# Patient Record
Sex: Female | Born: 1985 | Hispanic: No | Marital: Married
Health system: Southern US, Community
[De-identification: ages and names within clinical notes are randomized; demographics above are authoritative.]

---

## 2010-01-27 ENCOUNTER — Inpatient Hospital Stay (HOSPITAL_COMMUNITY): Admission: AD | Admit: 2010-01-27 | Discharge: 2010-01-27 | Payer: Self-pay | Admitting: Obstetrics & Gynecology

## 2010-02-11 ENCOUNTER — Ambulatory Visit: Payer: Self-pay | Admitting: Obstetrics & Gynecology

## 2010-02-11 ENCOUNTER — Encounter: Payer: Self-pay | Admitting: Obstetrics & Gynecology

## 2010-02-11 LAB — CONVERTED CEMR LAB
Hemoglobin: 9.4 g/dL — ABNORMAL LOW (ref 12.0–15.0)
RBC: 3.22 M/uL — ABNORMAL LOW (ref 3.87–5.11)
RDW: 16.6 % — ABNORMAL HIGH (ref 11.5–15.5)

## 2010-02-25 ENCOUNTER — Ambulatory Visit: Payer: Self-pay | Admitting: Obstetrics & Gynecology

## 2010-03-03 ENCOUNTER — Ambulatory Visit (HOSPITAL_COMMUNITY): Admission: RE | Admit: 2010-03-03 | Discharge: 2010-03-03 | Payer: Self-pay | Admitting: Obstetrics & Gynecology

## 2010-03-11 ENCOUNTER — Ambulatory Visit: Payer: Self-pay | Admitting: Obstetrics & Gynecology

## 2010-03-25 ENCOUNTER — Ambulatory Visit: Payer: Self-pay | Admitting: Family Medicine

## 2010-03-31 ENCOUNTER — Ambulatory Visit (HOSPITAL_COMMUNITY): Admission: RE | Admit: 2010-03-31 | Discharge: 2010-03-31 | Payer: Self-pay | Admitting: Obstetrics & Gynecology

## 2010-04-01 ENCOUNTER — Ambulatory Visit: Payer: Self-pay | Admitting: Family Medicine

## 2010-04-01 ENCOUNTER — Encounter (INDEPENDENT_AMBULATORY_CARE_PROVIDER_SITE_OTHER): Payer: Self-pay | Admitting: *Deleted

## 2010-04-12 ENCOUNTER — Encounter: Payer: Self-pay | Admitting: Obstetrics & Gynecology

## 2010-04-12 ENCOUNTER — Ambulatory Visit: Payer: Self-pay | Admitting: Family Medicine

## 2010-04-12 LAB — CONVERTED CEMR LAB: Chlamydia, DNA Probe: NEGATIVE

## 2010-04-19 ENCOUNTER — Ambulatory Visit: Payer: Self-pay | Admitting: Family Medicine

## 2010-04-21 ENCOUNTER — Ambulatory Visit (HOSPITAL_COMMUNITY): Admission: RE | Admit: 2010-04-21 | Discharge: 2010-04-21 | Payer: Self-pay | Admitting: Obstetrics & Gynecology

## 2010-04-26 ENCOUNTER — Ambulatory Visit: Payer: Self-pay | Admitting: Obstetrics & Gynecology

## 2010-04-26 ENCOUNTER — Encounter (INDEPENDENT_AMBULATORY_CARE_PROVIDER_SITE_OTHER): Payer: Self-pay | Admitting: *Deleted

## 2010-05-03 ENCOUNTER — Ambulatory Visit: Payer: Self-pay | Admitting: Obstetrics and Gynecology

## 2010-05-03 ENCOUNTER — Inpatient Hospital Stay (HOSPITAL_COMMUNITY): Admission: AD | Admit: 2010-05-03 | Discharge: 2010-05-05 | Payer: Self-pay | Admitting: Family Medicine

## 2010-05-03 ENCOUNTER — Encounter: Payer: Self-pay | Admitting: Obstetrics & Gynecology

## 2010-05-03 ENCOUNTER — Ambulatory Visit: Payer: Self-pay | Admitting: Family Medicine

## 2010-05-12 ENCOUNTER — Inpatient Hospital Stay (HOSPITAL_COMMUNITY): Admission: AD | Admit: 2010-05-12 | Discharge: 2010-05-12 | Payer: Self-pay | Admitting: Obstetrics & Gynecology

## 2010-05-13 ENCOUNTER — Ambulatory Visit: Payer: Self-pay | Admitting: Obstetrics & Gynecology

## 2010-06-24 ENCOUNTER — Ambulatory Visit: Payer: Self-pay | Admitting: Obstetrics and Gynecology

## 2010-06-24 ENCOUNTER — Other Ambulatory Visit: Payer: Self-pay | Admitting: Family Medicine

## 2010-07-01 ENCOUNTER — Ambulatory Visit: Payer: Self-pay | Admitting: Obstetrics & Gynecology

## 2010-09-02 ENCOUNTER — Ambulatory Visit: Payer: Self-pay | Admitting: Obstetrics & Gynecology

## 2010-10-03 ENCOUNTER — Encounter: Payer: Self-pay | Admitting: *Deleted

## 2010-11-25 LAB — POCT URINALYSIS DIPSTICK
Bilirubin Urine: NEGATIVE
Glucose, UA: NEGATIVE mg/dL
Nitrite: NEGATIVE
Specific Gravity, Urine: 1.025 (ref 1.005–1.030)
Urobilinogen, UA: 0.2 mg/dL (ref 0.0–1.0)
pH: 5 (ref 5.0–8.0)

## 2010-11-26 LAB — POCT URINALYSIS DIPSTICK
Bilirubin Urine: NEGATIVE
Glucose, UA: NEGATIVE mg/dL
Hgb urine dipstick: NEGATIVE
Ketones, ur: NEGATIVE mg/dL
Nitrite: NEGATIVE
Nitrite: NEGATIVE
Protein, ur: NEGATIVE mg/dL
Protein, ur: NEGATIVE mg/dL
Specific Gravity, Urine: 1.01 (ref 1.005–1.030)
Specific Gravity, Urine: 1.02 (ref 1.005–1.030)
Urobilinogen, UA: 0.2 mg/dL (ref 0.0–1.0)
pH: 6 (ref 5.0–8.0)
pH: 5.5 (ref 5.0–8.0)
pH: 6.5 (ref 5.0–8.0)

## 2010-11-26 LAB — CBC
HCT: 29.8 % — ABNORMAL LOW (ref 36.0–46.0)
Hemoglobin: 10.1 g/dL — ABNORMAL LOW (ref 12.0–15.0)
MCH: 30.2 pg (ref 26.0–34.0)
MCH: 30.9 pg (ref 26.0–34.0)
MCHC: 33.1 g/dL (ref 30.0–36.0)
MCHC: 33.9 g/dL (ref 30.0–36.0)
RDW: 16.8 % — ABNORMAL HIGH (ref 11.5–15.5)
RDW: 17.2 % — ABNORMAL HIGH (ref 11.5–15.5)

## 2010-11-26 LAB — RPR: RPR Ser Ql: NONREACTIVE

## 2010-11-27 LAB — POCT URINALYSIS DIP (DEVICE)
Bilirubin Urine: NEGATIVE
Glucose, UA: NEGATIVE mg/dL
Ketones, ur: NEGATIVE mg/dL
Nitrite: NEGATIVE
Protein, ur: NEGATIVE mg/dL

## 2010-11-28 LAB — POCT URINALYSIS DIP (DEVICE)
Bilirubin Urine: NEGATIVE
Glucose, UA: 100 mg/dL — AB
Glucose, UA: NEGATIVE mg/dL
Hgb urine dipstick: NEGATIVE
Hgb urine dipstick: NEGATIVE
Hgb urine dipstick: NEGATIVE
Ketones, ur: NEGATIVE mg/dL
Nitrite: NEGATIVE
Nitrite: NEGATIVE
Protein, ur: NEGATIVE mg/dL
Protein, ur: NEGATIVE mg/dL
Specific Gravity, Urine: 1.015 (ref 1.005–1.030)
Urobilinogen, UA: 0.2 mg/dL (ref 0.0–1.0)
Urobilinogen, UA: 0.2 mg/dL (ref 0.0–1.0)
pH: 5.5 (ref 5.0–8.0)

## 2010-11-29 LAB — POCT URINALYSIS DIP (DEVICE)
Bilirubin Urine: NEGATIVE
Hgb urine dipstick: NEGATIVE
Ketones, ur: NEGATIVE mg/dL
Specific Gravity, Urine: 1.02 (ref 1.005–1.030)
pH: 7 (ref 5.0–8.0)

## 2010-11-29 LAB — CBC
Hemoglobin: 9.8 g/dL — ABNORMAL LOW (ref 12.0–15.0)
MCV: 91 fL (ref 78.0–100.0)
Platelets: 308 10*3/uL (ref 150–400)
RBC: 3.2 MIL/uL — ABNORMAL LOW (ref 3.87–5.11)
WBC: 9.6 10*3/uL (ref 4.0–10.5)

## 2010-11-29 LAB — URINE MICROSCOPIC-ADD ON

## 2010-11-29 LAB — URINALYSIS, ROUTINE W REFLEX MICROSCOPIC
Ketones, ur: 15 mg/dL — AB
Nitrite: NEGATIVE
Urobilinogen, UA: 0.2 mg/dL (ref 0.0–1.0)

## 2012-04-24 IMAGING — US US OB COMP +14 WK
1 series · 14 of 28 positions shown · non-contrast
Comparison: none

OBSTETRICAL ULTRASOUND:
 This ultrasound exam was performed in the [HOSPITAL] Ultrasound Department.  The OB US report was generated in the AS system, and faxed to the ordering physician.  This report is also available in [HOSPITAL]?s [employer] and in [REDACTED] PACS.

[Series 1: us ob comp +14 wk · 14 of 76 slices shown]
[im 3/76]
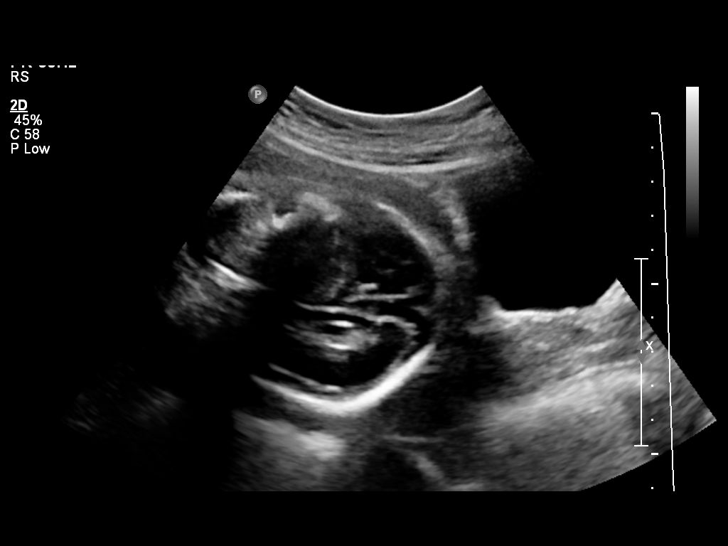
[im 9/76]
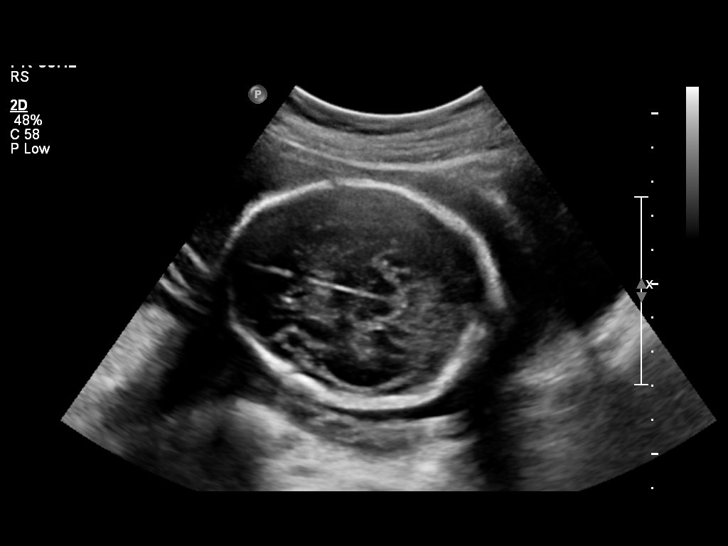
[im 14/76]
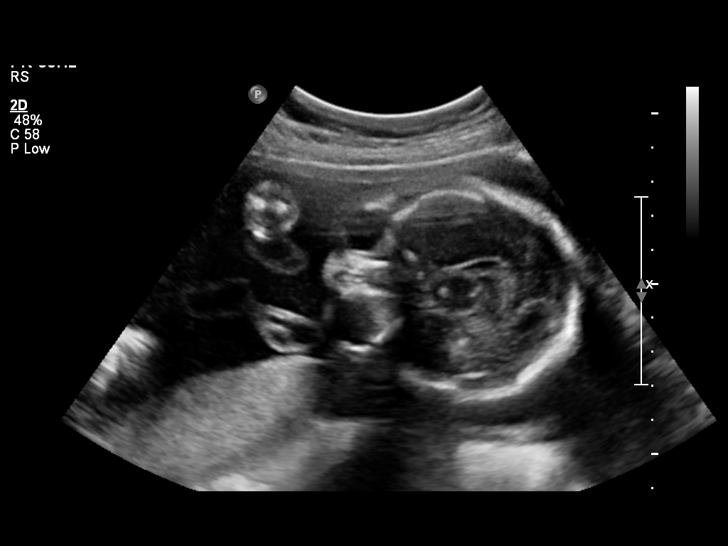
[im 20/76]
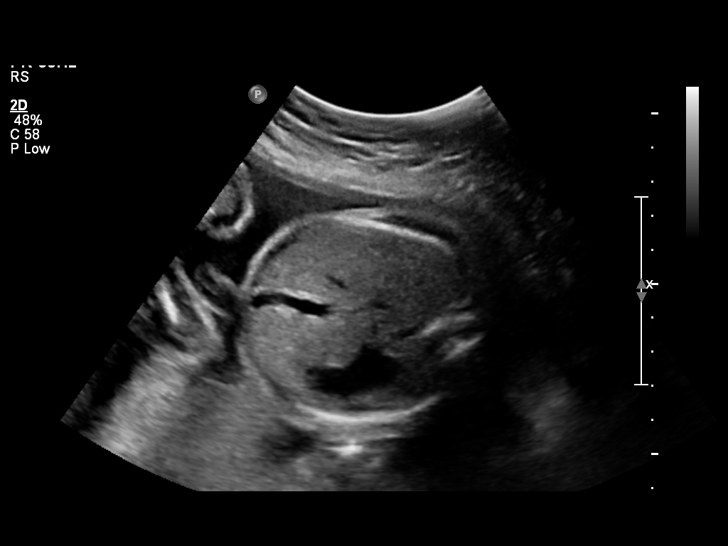
[im 26/76]
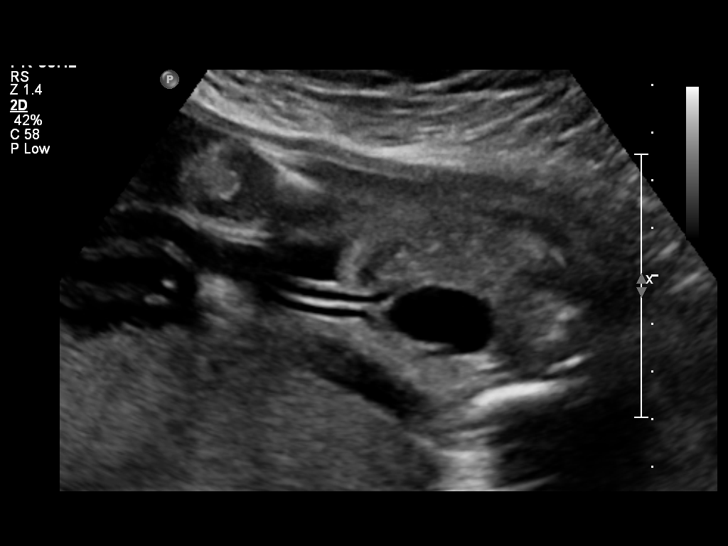
[im 31/76]
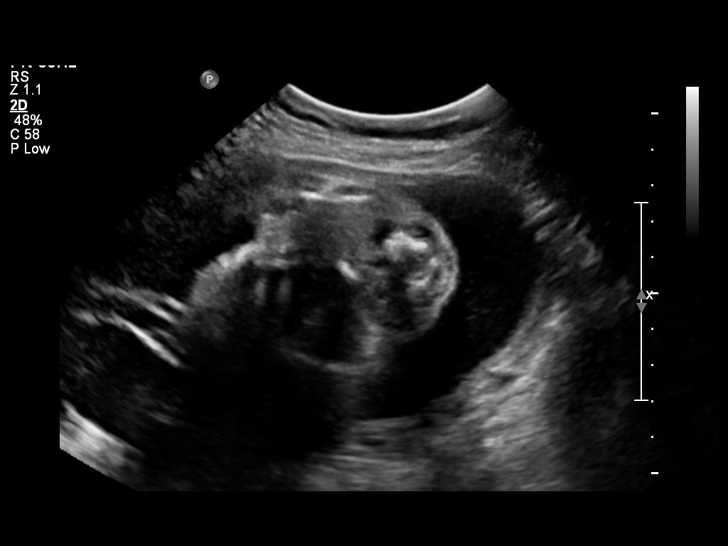
[im 37/76]
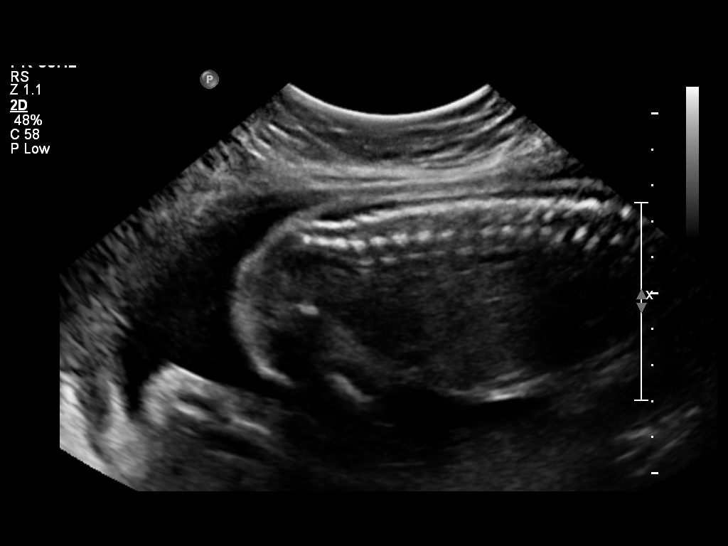
[im 42/76]
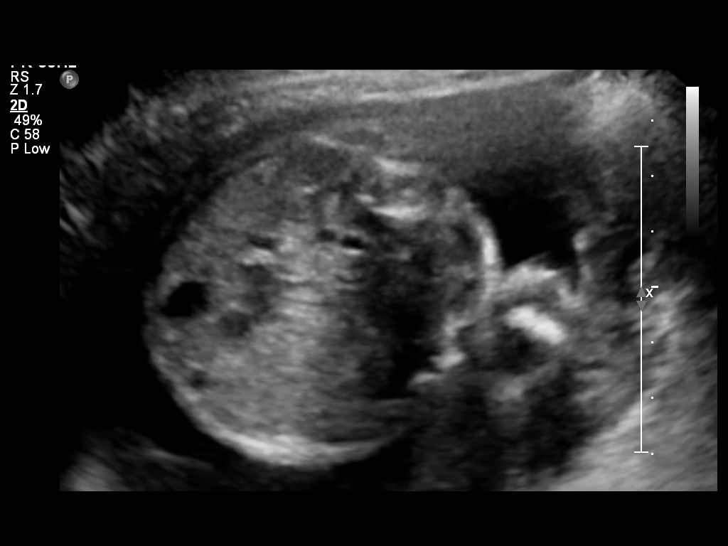
[im 48/76]
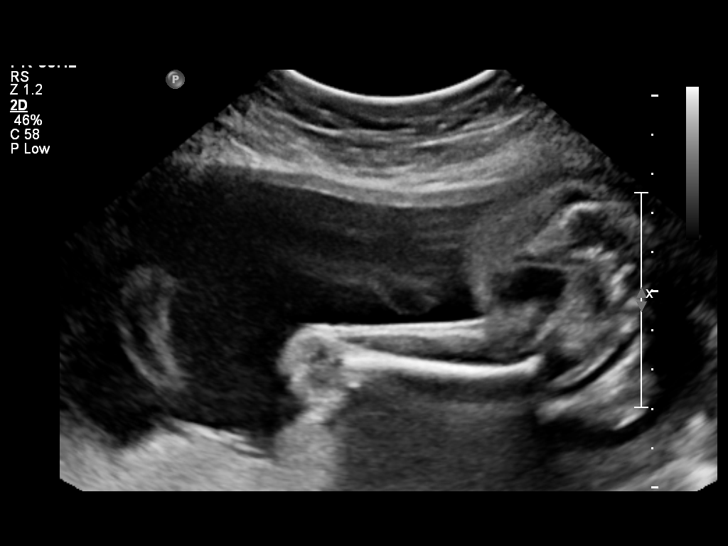
[im 53/76]
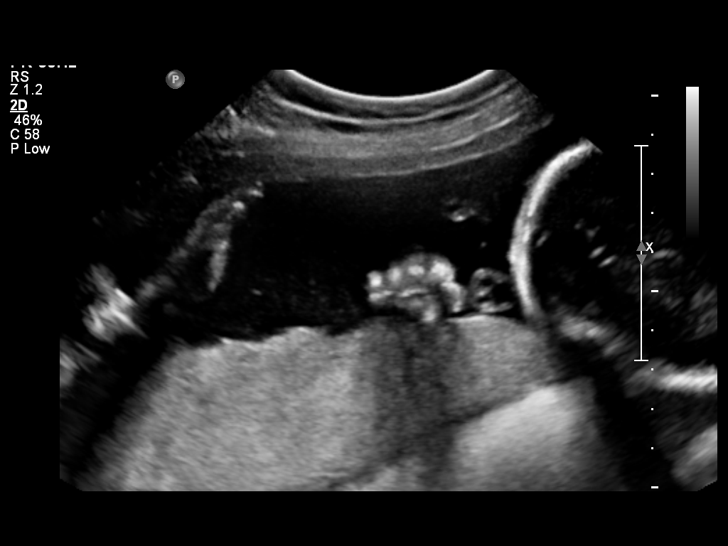
[im 59/76]
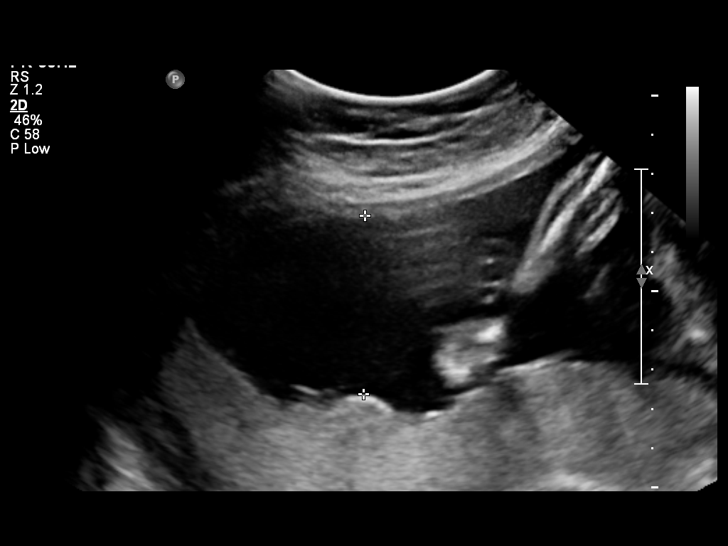
[im 64/76]
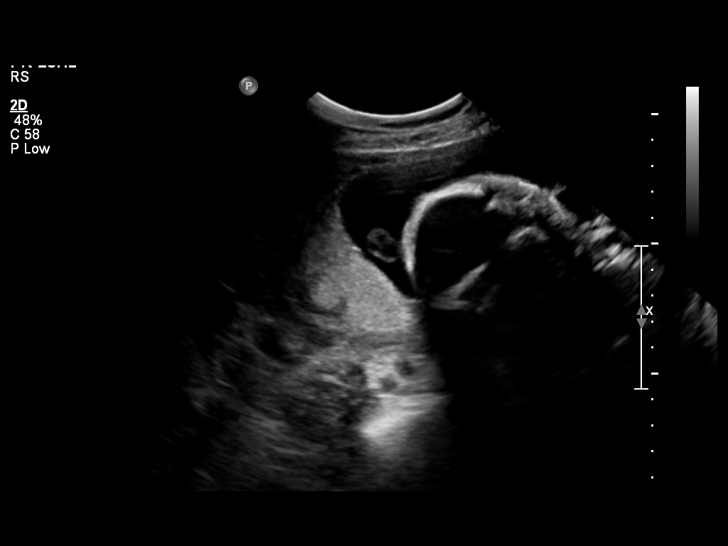
[im 70/76]
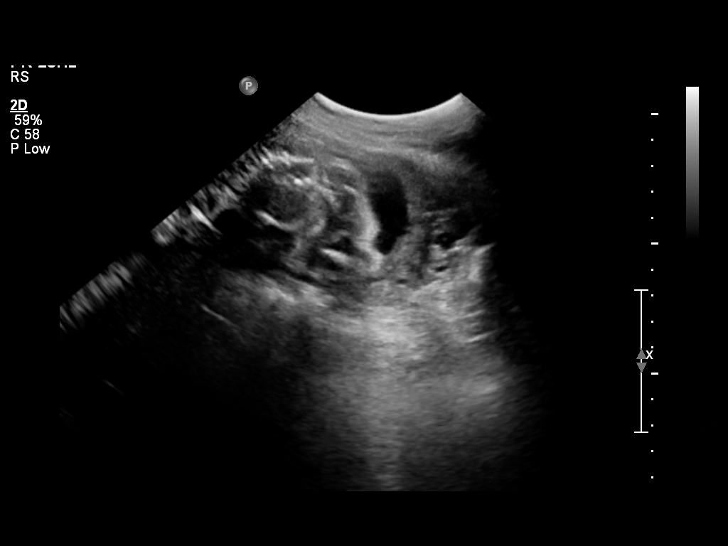
[im 76/76]
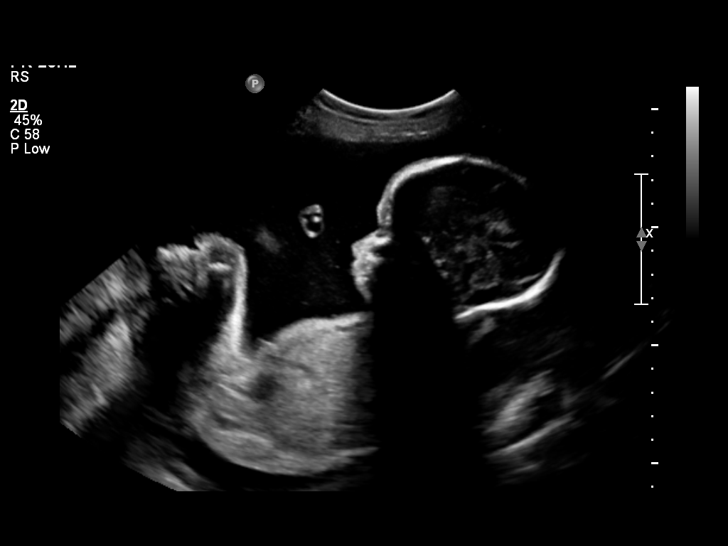

[14 of 28 positions shown; findings below may reference images not displayed]

IMPRESSION: See [facility] Obstetric US report.

## 2012-07-17 IMAGING — US US OB FOLLOW-UP
1 series · 14 of 28 positions shown · non-contrast
Comparison: none

OBSTETRICAL ULTRASOUND:
 This ultrasound was performed in The [HOSPITAL], and the [facility]GY[facility] report will be stored to [REDACTED] PACS.  This report is also available in [HOSPITAL]?s [employer].

[Series 1: us ob follow-up · 14 of 39 slices shown]
[im 2/39]
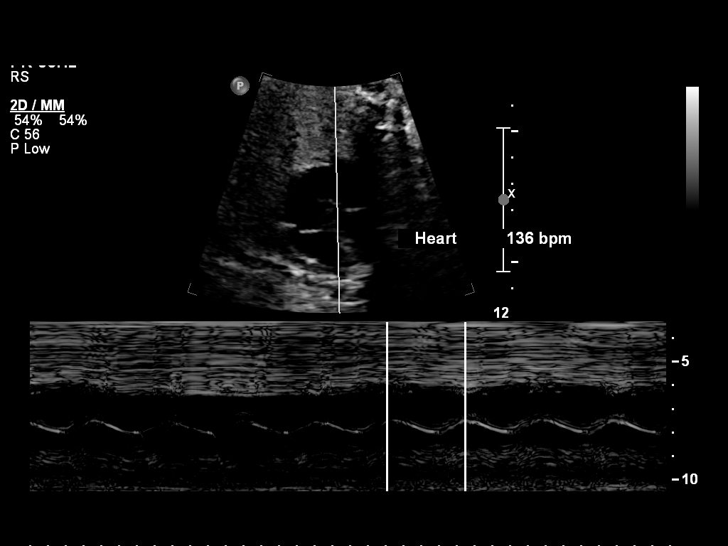
[im 5/39]
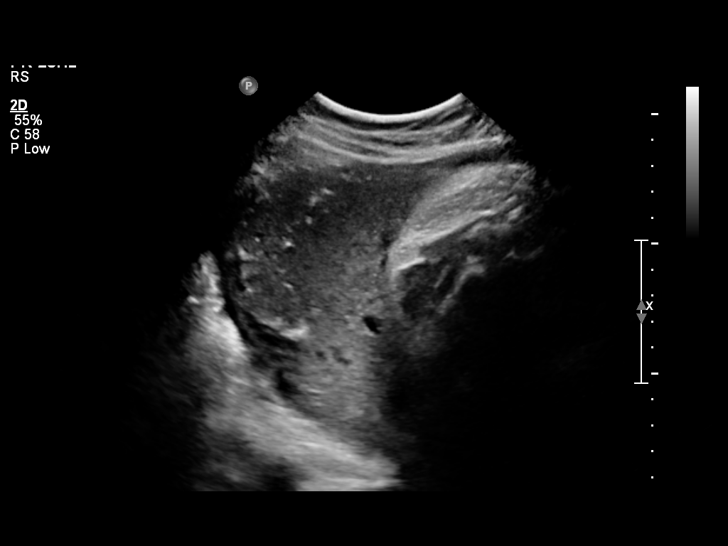
[im 8/39]
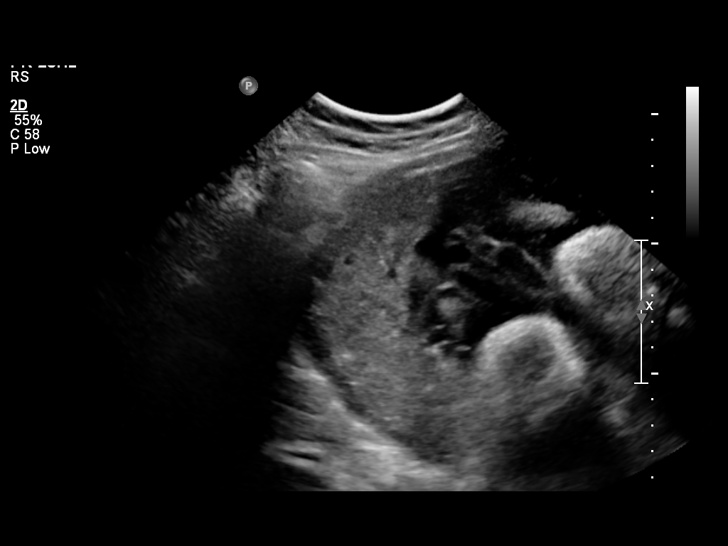
[im 10/39]
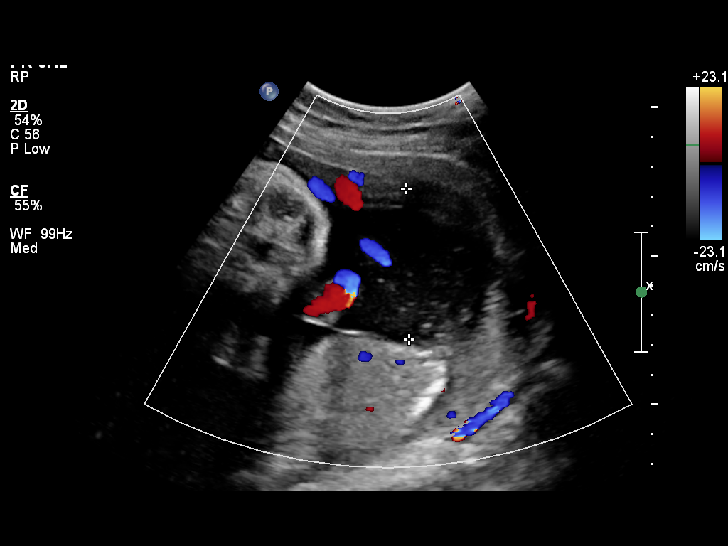
[im 13/39]
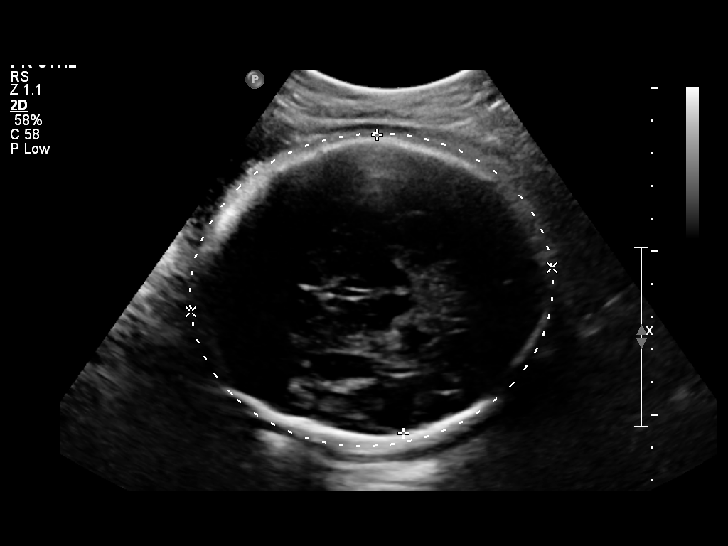
[im 16/39]
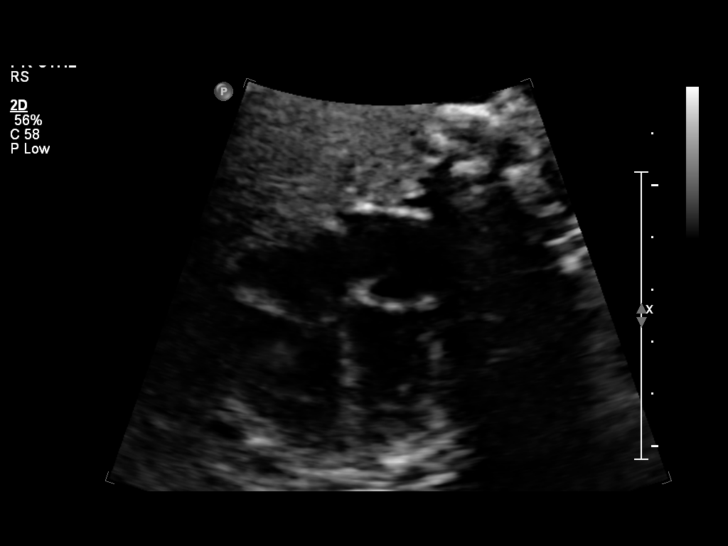
[im 19/39]
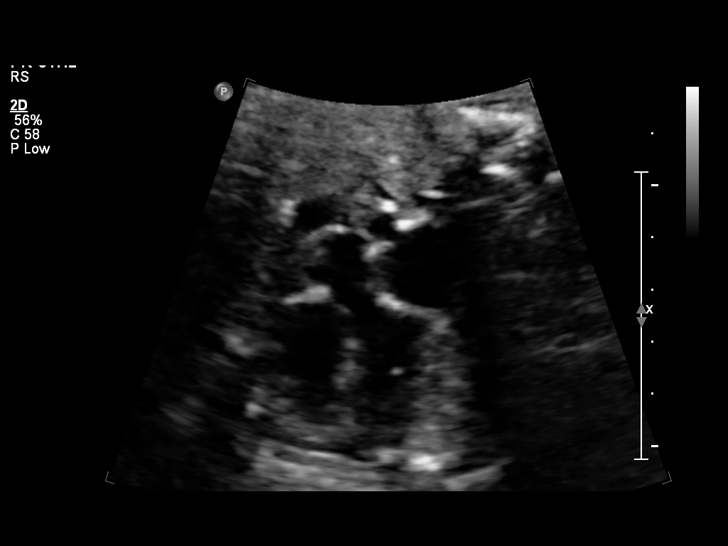
[im 22/39]
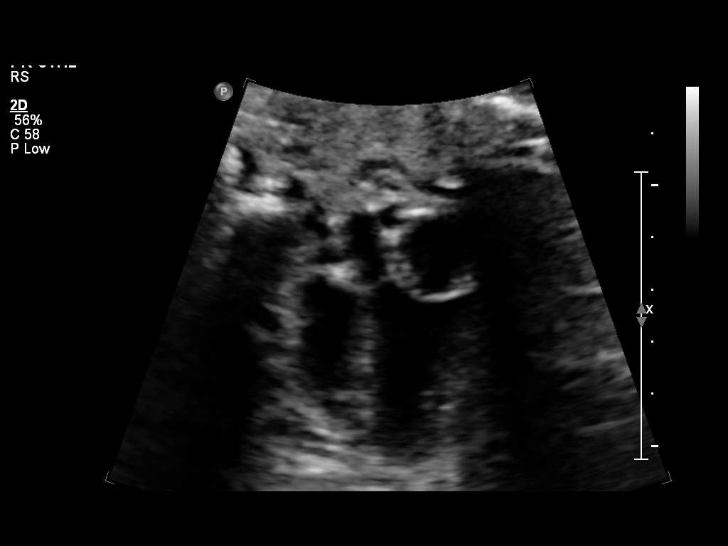
[im 24/39]
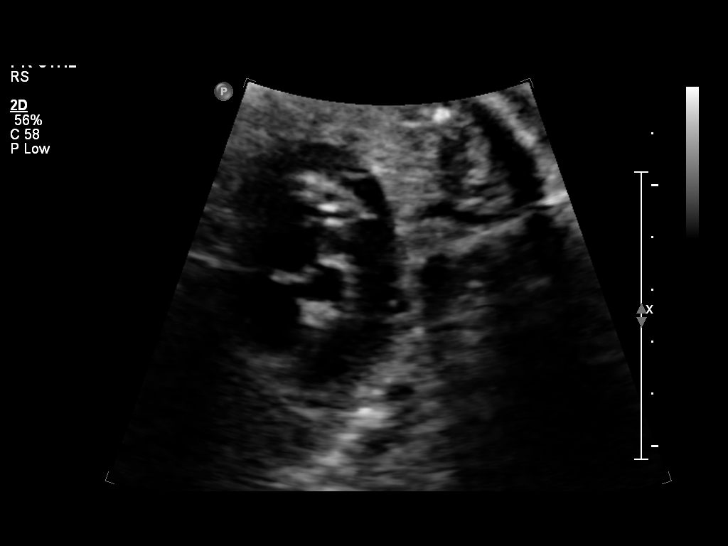
[im 27/39]
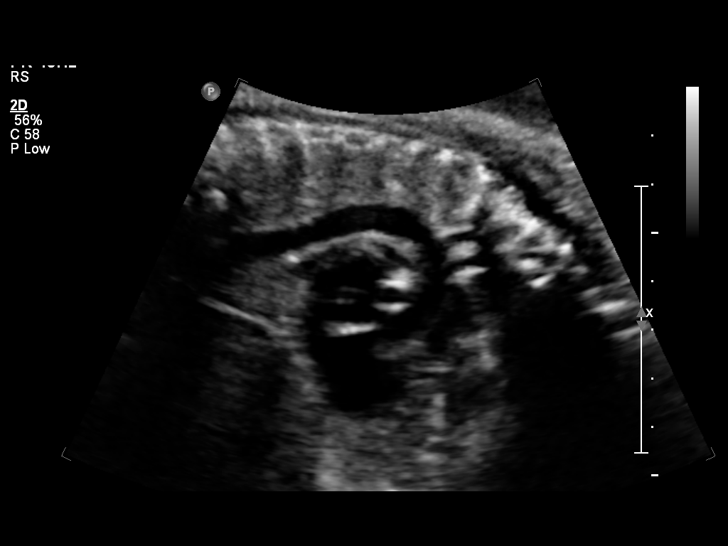
[im 30/39]
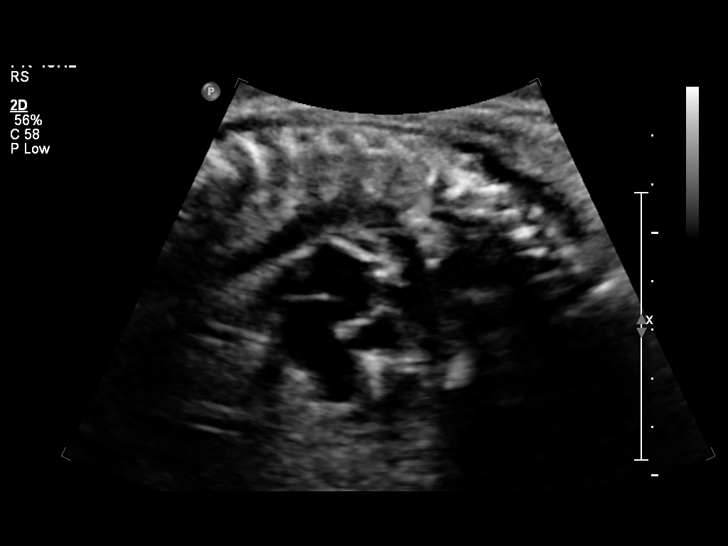
[im 33/39]
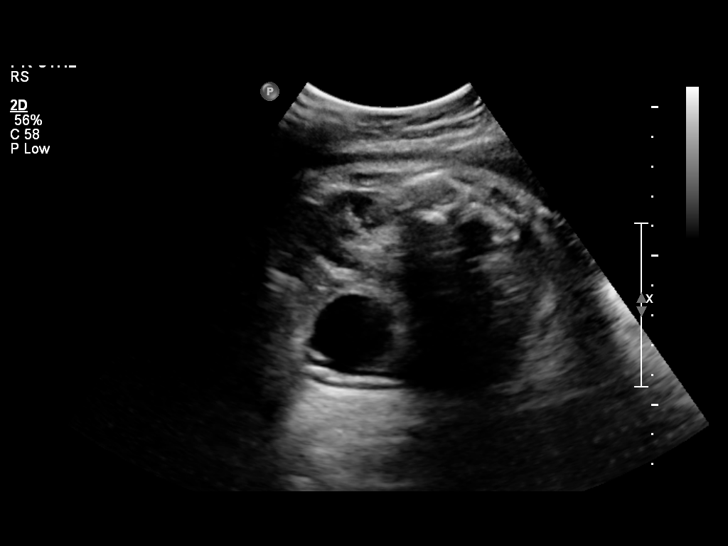
[im 36/39]
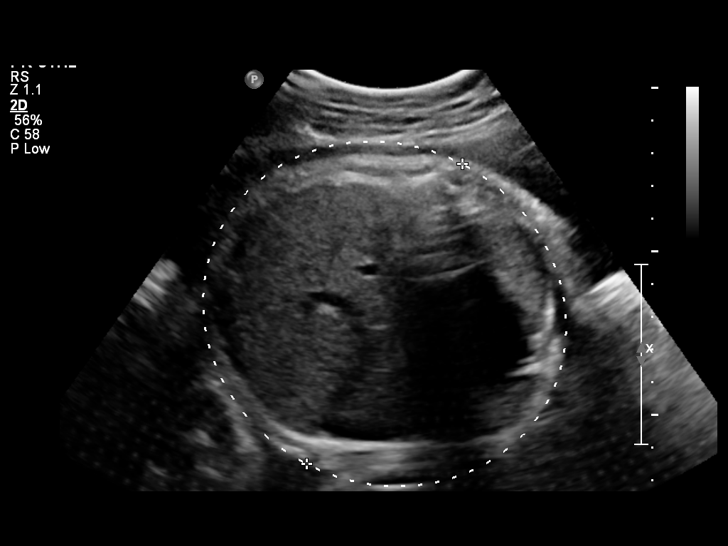
[im 39/39]
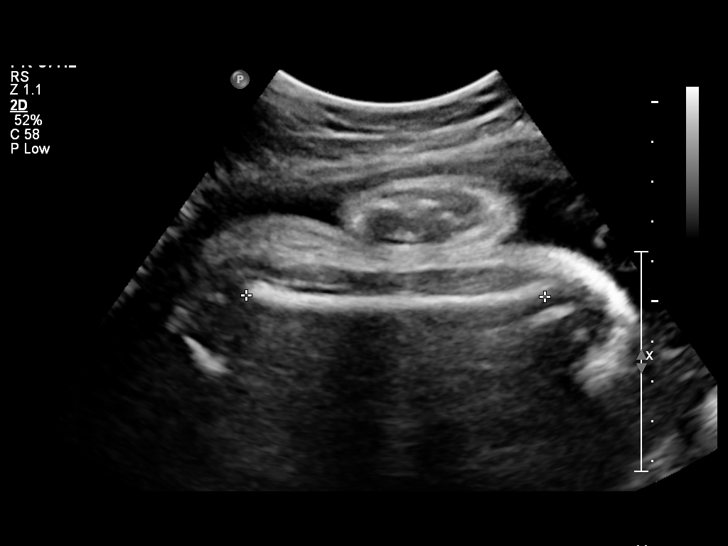

[14 of 28 positions shown; findings below may reference images not displayed]

IMPRESSION: [facility] has also been faxed to the ordering physician.

## 2016-10-26 NOTE — Progress Notes (Signed)
This encounter was created in error - please disregard.
